# Patient Record
Sex: Female | Born: 1960 | Race: White | Hispanic: No | Marital: Married | Smoking: Never smoker
Health system: Southern US, Community
[De-identification: ages and names within clinical notes are randomized; demographics above are authoritative.]

## PROBLEM LIST (undated history)

## (undated) DIAGNOSIS — M549 Dorsalgia, unspecified: Secondary | ICD-10-CM

## (undated) DIAGNOSIS — G43909 Migraine, unspecified, not intractable, without status migrainosus: Secondary | ICD-10-CM

## (undated) DIAGNOSIS — I10 Essential (primary) hypertension: Secondary | ICD-10-CM

## (undated) HISTORY — PX: CHOLECYSTECTOMY: SHX55

---

## 2004-04-29 ENCOUNTER — Ambulatory Visit: Payer: Self-pay | Admitting: Cardiology

## 2004-04-29 ENCOUNTER — Observation Stay (HOSPITAL_COMMUNITY): Admission: RE | Admit: 2004-04-29 | Discharge: 2004-04-30 | Payer: Self-pay | Admitting: Orthopedic Surgery

## 2005-11-17 ENCOUNTER — Ambulatory Visit (HOSPITAL_COMMUNITY): Admission: RE | Admit: 2005-11-17 | Discharge: 2005-11-17 | Payer: Self-pay | Admitting: Neurological Surgery

## 2008-02-03 IMAGING — CR DG CHEST 2V
2 series · 2 of 2 positions shown · non-contrast
Comparison: 04/28/04
 The heart size and mediastinal contours are within normal limits.  Both lungs are clear.  The visualized skeletal structures are unremarkable.

CLINICAL DATA: Herniated disc.  Preadmission radiograph.  
 ACOH7-X VIEWS:

[view not recorded (1 of 2)]
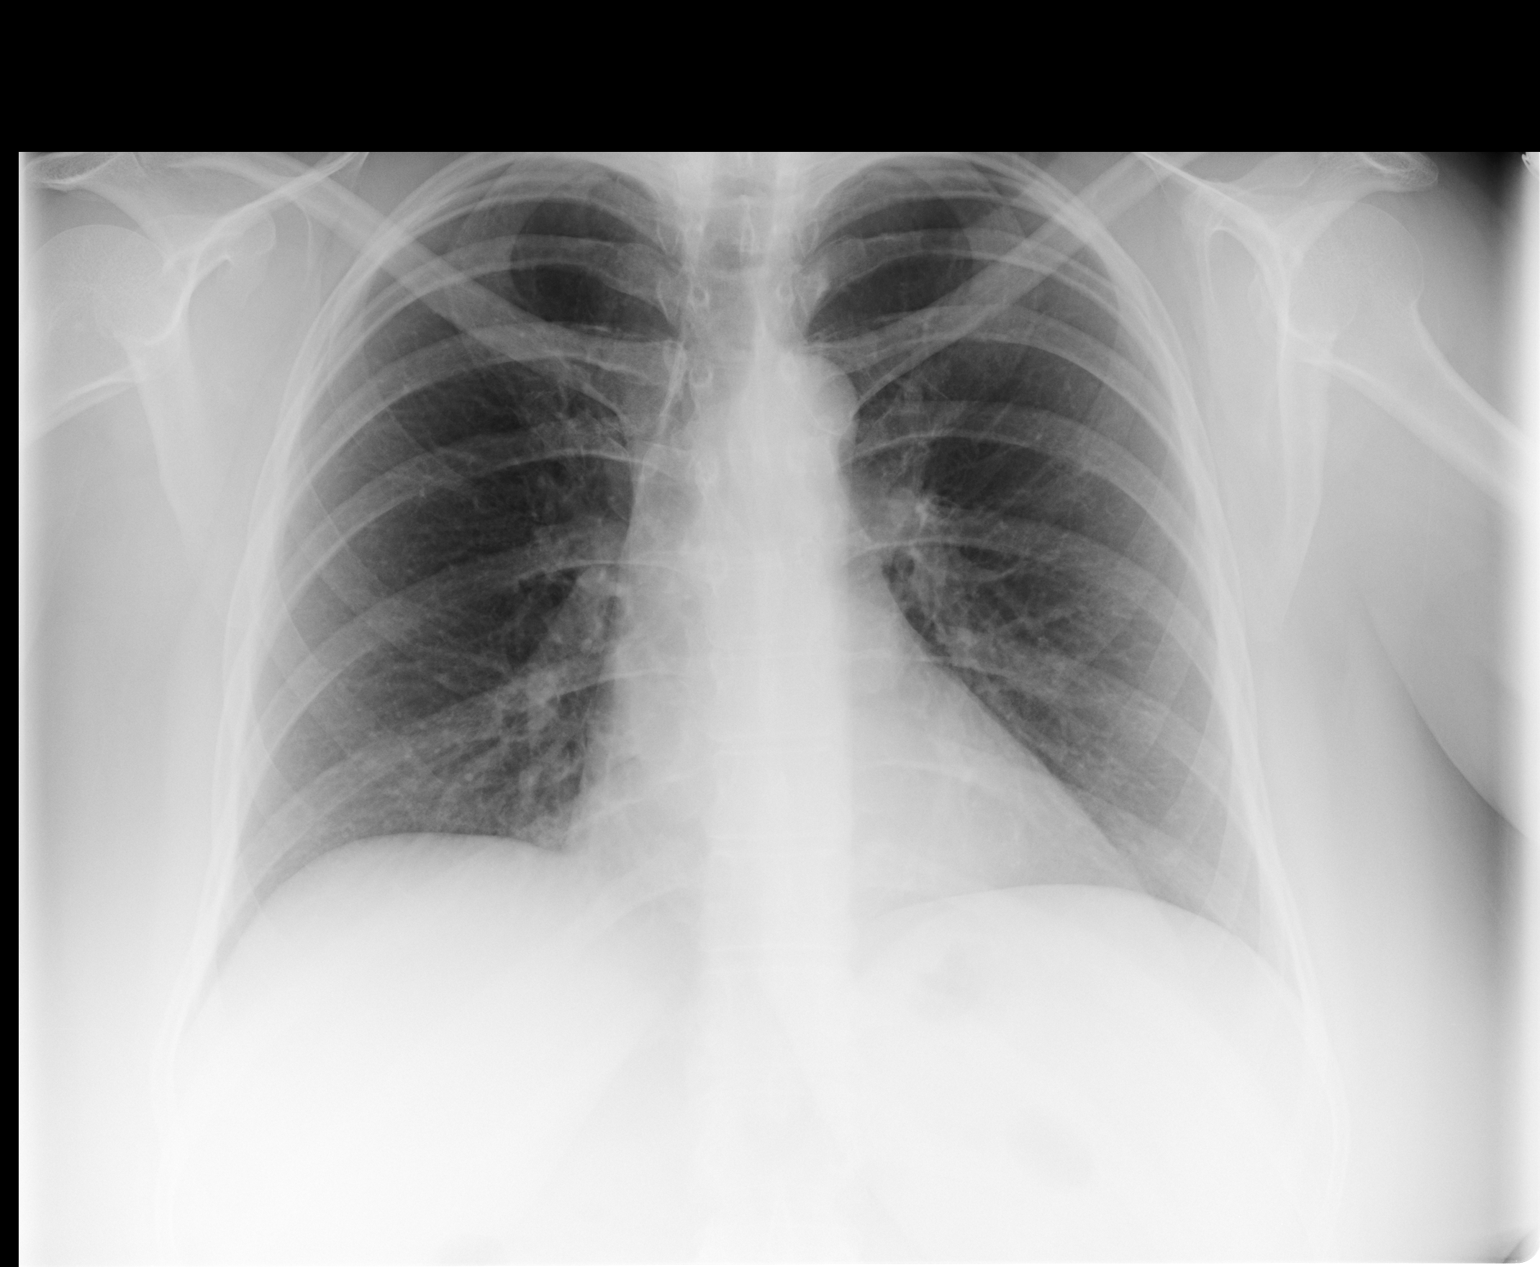

[view not recorded (2 of 2)]
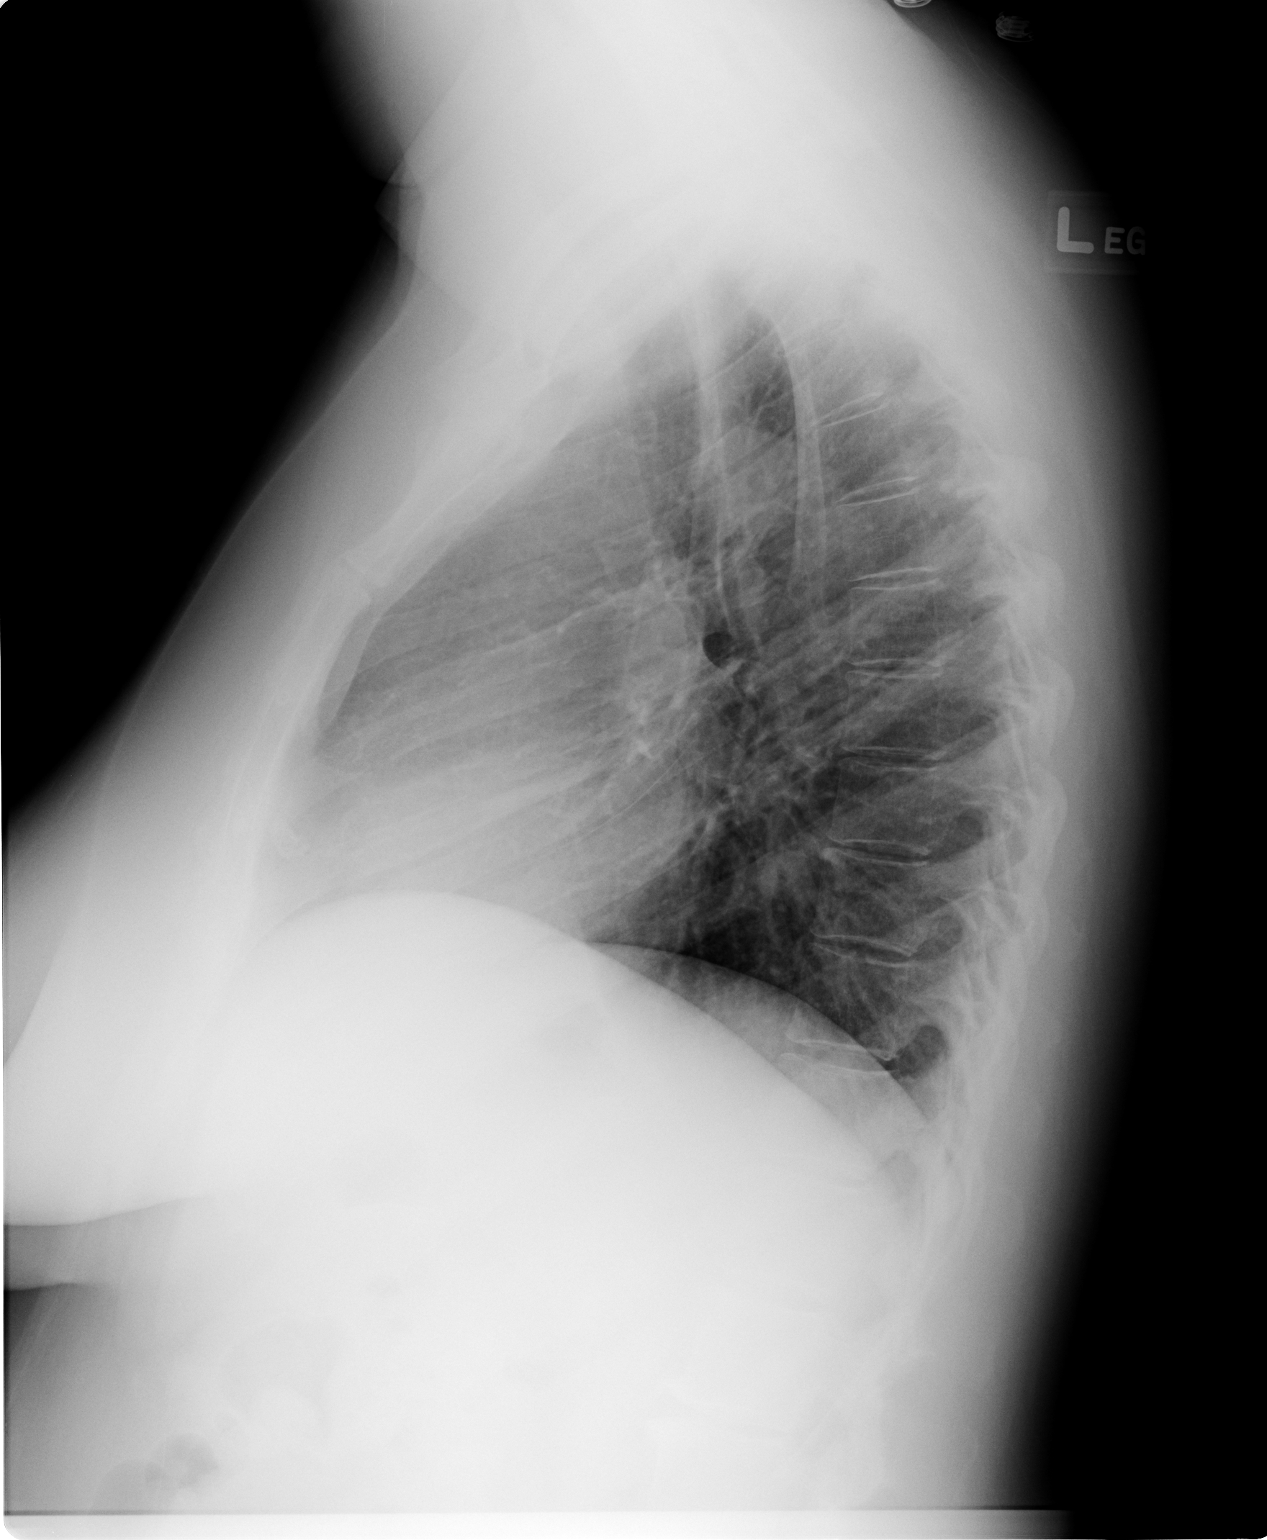

[2 of 2 positions shown; findings below may reference images not displayed]

IMPRESSION: No active cardiopulmonary disease.

## 2008-02-07 IMAGING — CR DG LUMBAR SPINE 1V
1 series · 1 of 1 positions shown · non-contrast
Comparison: 04/29/04

CLINICAL DATA: L4-L5-S1 microdiskectomy.
 LUMBAR SPINE ? 1 VIEW:

[view not recorded]
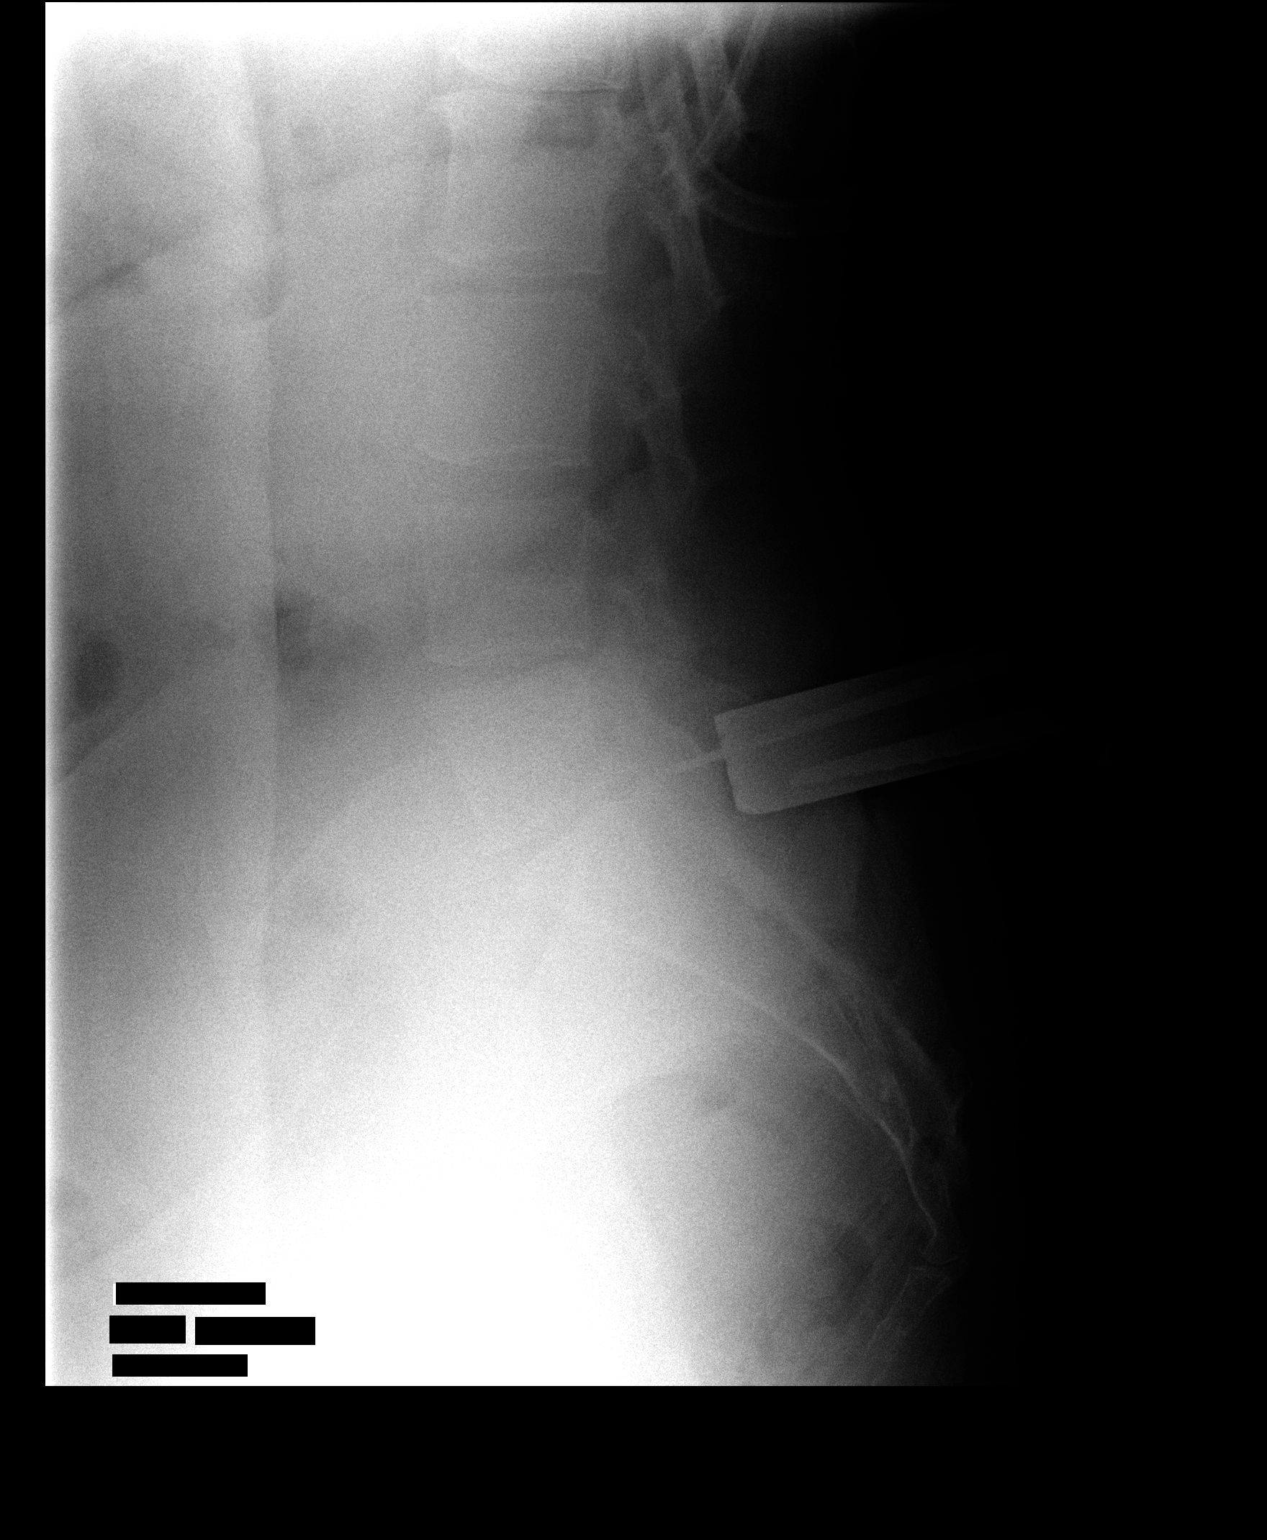

[1 of 1 positions shown; findings below may reference images not displayed]

FINDINGS: Single lateral view labeled film #1 at 4808 hours demonstrates a surgical device projecting posterior to the L5-S1 intervertebral disk.  Straightening of the expected lumbar lordosis.
IMPRESSION: Localization of the L5-S1 level.

## 2022-08-08 ENCOUNTER — Emergency Department (HOSPITAL_BASED_OUTPATIENT_CLINIC_OR_DEPARTMENT_OTHER): Payer: BC Managed Care – PPO

## 2022-08-08 ENCOUNTER — Other Ambulatory Visit: Payer: Self-pay

## 2022-08-08 ENCOUNTER — Encounter (HOSPITAL_BASED_OUTPATIENT_CLINIC_OR_DEPARTMENT_OTHER): Payer: Self-pay | Admitting: Emergency Medicine

## 2022-08-08 ENCOUNTER — Emergency Department (HOSPITAL_BASED_OUTPATIENT_CLINIC_OR_DEPARTMENT_OTHER)
Admission: EM | Admit: 2022-08-08 | Discharge: 2022-08-09 | Disposition: A | Discharge status: Home / Self Care | Payer: BC Managed Care – PPO | Attending: Emergency Medicine | Admitting: Emergency Medicine

## 2022-08-08 DIAGNOSIS — S300XXA Contusion of lower back and pelvis, initial encounter: Secondary | ICD-10-CM | POA: Diagnosis not present

## 2022-08-08 DIAGNOSIS — W010XXA Fall on same level from slipping, tripping and stumbling without subsequent striking against object, initial encounter: Secondary | ICD-10-CM | POA: Diagnosis not present

## 2022-08-08 DIAGNOSIS — M25552 Pain in left hip: Secondary | ICD-10-CM | POA: Diagnosis not present

## 2022-08-08 DIAGNOSIS — S20211A Contusion of right front wall of thorax, initial encounter: Secondary | ICD-10-CM | POA: Insufficient documentation

## 2022-08-08 DIAGNOSIS — R0789 Other chest pain: Secondary | ICD-10-CM | POA: Diagnosis present

## 2022-08-08 DIAGNOSIS — T07XXXA Unspecified multiple injuries, initial encounter: Secondary | ICD-10-CM

## 2022-08-08 MED ORDER — ACETAMINOPHEN 500 MG PO TABS
1000.0000 mg | ORAL_TABLET | Freq: Once | ORAL | Status: AC
  Administered 2022-08-08: 1000 mg via ORAL
  Filled 2022-08-08: qty 2

## 2022-08-08 MED ORDER — KETOROLAC TROMETHAMINE 15 MG/ML IJ SOLN
15.0000 mg | Freq: Once | INTRAMUSCULAR | Status: AC
  Administered 2022-08-08: 15 mg via INTRAMUSCULAR
  Filled 2022-08-08: qty 1

## 2022-08-08 NOTE — ED Provider Notes (Signed)
[facility] Provider Note  CSN: [id number] Arrival date & time: 08/08/22 1810  Chief Complaint(s) Fall  HPI Cheryl Lawrence is a 62 y.o. female who denies significant past medical history presenting to the emergency department with pain.  Patient reports right-sided chest pain worse with movement, left flank left hip pain.  She reports that her granddaughter was crawling underneath her and she tried to avoid and then fell into a kitchen table.  Did not hit her head or lose consciousness.  No nausea or vomiting.  No shortness of breath.  No pain in the arms or legs.  Has been ambulatory.  Did not take anything for pain before coming to the emergency department.  Past Medical History History reviewed. No pertinent past medical history. There are no problems to display for this patient.  Home Medication(s) Prior to Admission medications   Not on File                                                                                                                                    Past Surgical History History reviewed. No pertinent surgical history. Family History History reviewed. No pertinent family history.  Social History Social History   Tobacco Use   Smoking status: Never   Smokeless tobacco: Never   Allergies Duloxetine, Furosemide, Penicillins, Valproic acid, Codeine, Morphine, Nortriptyline, and Other  Review of Systems Review of Systems  All other systems reviewed and are negative.   Physical Exam Vital Signs  I have reviewed the triage vital signs BP (!) 176/99   Pulse 92   Temp 98.2 F (36.8 C) (Oral)   Resp 18   Ht 5\' 5"  (1.651 m)   Wt 93 kg   SpO2 99%   BMI 34.11 kg/m  Physical Exam Vitals and nursing note reviewed.  Constitutional:      General: She is not in acute distress.    Appearance: She is well-developed.  HENT:     Head: Normocephalic and atraumatic.     Mouth/Throat:     Mouth: Mucous  membranes are moist.  Eyes:     Pupils: Pupils are equal, round, and reactive to light.  Cardiovascular:     Rate and Rhythm: Normal rate and regular rhythm.     Heart sounds: No murmur heard. Pulmonary:     Effort: Pulmonary effort is normal. No respiratory distress.     Breath sounds: Normal breath sounds.  Abdominal:     General: Abdomen is flat.     Palpations: Abdomen is soft.     Tenderness: There is no abdominal tenderness.  Musculoskeletal:        General: No tenderness.     Right lower leg: No edema.     Left lower leg: No edema.     Comments: No midline C, T, L-spine tenderness.  No chest wall tenderness or crepitus.  Full painless range  of motion at the bilateral upper extremities including the shoulders, elbows, wrists, hand and fingers, and in the bilateral lower extremities including the hips, knees, ankle, toes.  No focal bony tenderness, injury or deformity.   Skin:    General: Skin is warm and dry.     Comments: Small linear ecchymosis over the right lateral chest wall without underlying crepitus or bony tenderness, small ecchymosis over the left paraspinal lumbar region.  Neurological:     General: No focal deficit present.     Mental Status: She is alert. Mental status is at baseline.  Psychiatric:        Mood and Affect: Mood normal.        Behavior: Behavior normal.     ED Results and Treatments Labs (all labs ordered are listed, but only abnormal results are displayed) Labs Reviewed - No data to display                                                                                                                        Radiology DG Hip Unilat W or Wo Pelvis 2-3 Views Right  Result Date: 08/08/2022 CLINICAL DATA:  Recent trip and fall with right hip pain, initial encounter EXAM: DG HIP (WITH OR WITHOUT PELVIS) 3V RIGHT COMPARISON:  None Available. FINDINGS: Pelvic ring is intact. Degenerative changes of lumbar spine are noted. No acute fracture or  dislocation is seen. No soft tissue abnormality is noted. IMPRESSION: No acute abnormality noted. Electronically Signed   By: Alcide Clever M.D.   On: 08/08/2022 19:40   DG Chest 2 View  Result Date: 08/08/2022 CLINICAL DATA:  Recent fall with chest pain, initial encounter EXAM: CHEST - 2 VIEW COMPARISON:  06/08/2021 FINDINGS: Cardiac shadow is stable. Lungs are clear bilaterally. No pneumothorax is noted. Degenerative changes of the thoracic spine are noted. No acute bony abnormality is seen. IMPRESSION: No active cardiopulmonary disease. Electronically Signed   By: Alcide Clever M.D.   On: 08/08/2022 19:40    Pertinent labs & imaging results that were available during my care of the patient were reviewed by me and considered in my medical decision making (see MDM for details).  Medications Ordered in ED Medications  acetaminophen (TYLENOL) tablet 1,000 mg (1,000 mg Oral Given 08/08/22 2258)  ketorolac (TORADOL) 15 MG/ML injection 15 mg (15 mg Intramuscular Given 08/08/22 2258)  Procedures Procedures  (including critical care time)  Medical Decision Making / ED Course   MDM:  62 year old female presenting to the emergency department with fall.  Patient reports she tripped over her granddaughter.  On exam she has scattered ecchymoses.  No evidence of head injury.  No evidence of extremity injury.  No midline spinal tenderness.  Lungs clear.  X-rays ordered in triage including x-ray of the right hip, chest x-ray without evidence of fracture.  Very low concern for significant traumatic injury.  Patient has not take anything for pain since coming to the emergency department.  Will give Tylenol and Toradol.  Discussed home care for symptoms including Tylenol and ibuprofen. Will discharge patient to home. All questions answered. Patient comfortable with plan of  discharge. Return precautions discussed with patient and specified on the after visit summary.       Additional history obtained: -Additional history obtained from spouse    Imaging Studies ordered: I ordered imaging studies including XR hip and chest On my interpretation imaging demonstrates no acute process I independently visualized and interpreted imaging. I agree with the radiologist interpretation   Medicines ordered and prescription drug management: Meds ordered this encounter  Medications   acetaminophen (TYLENOL) tablet 1,000 mg   ketorolac (TORADOL) 15 MG/ML injection 15 mg    -I have reviewed the patients home medicines and have made adjustments as needed   Social Determinants of Health:  Diagnosis or treatment significantly limited by social determinants of health: obesity   Reevaluation: After the interventions noted above, I reevaluated the patient and found that their symptoms have improved  Co morbidities that complicate the patient evaluation History reviewed. No pertinent past medical history.    Dispostion: Disposition decision including need for hospitalization was considered, and patient discharged from emergency department.    Final Clinical Impression(s) / ED Diagnoses Final diagnoses:  Multiple contusions     This chart was dictated using voice recognition software.  Despite best efforts to proofread,  errors can occur which can change the documentation meaning.    Lonell Grandchild, MD 08/08/22 2303

## 2022-08-08 NOTE — ED Triage Notes (Signed)
Pt sts she tripped over her granddaughter today and hit the kitchen bar on the way down; she c/o pain to LT flank, LT hip and RT ribs

## 2022-08-08 NOTE — Discharge Instructions (Addendum)
We evaluated you in the emergency department after your fall.  Your x-rays did not show any signs of broken bones.  You likely have contusions, which is a type of bruise.   Please take Tylenol and Motrin for your symptoms at home.  You can take 1000 mg of Tylenol every 6 hours and 600 mg of ibuprofen every 6 hours as needed for your symptoms.  You can take these medicines together as needed, either at the same time, or alternating every 3 hours.  You can also apply ice packs to your areas of pain.  Please follow-up with your primary doctor.  Please return to the emergency department if you develop any new symptoms such as difficulty breathing, severe pain, fevers or chills, confusion, nausea or vomiting, or any other concerning symptoms.

## 2022-11-18 ENCOUNTER — Encounter (HOSPITAL_BASED_OUTPATIENT_CLINIC_OR_DEPARTMENT_OTHER): Payer: Self-pay | Admitting: Emergency Medicine

## 2022-11-18 ENCOUNTER — Emergency Department (HOSPITAL_BASED_OUTPATIENT_CLINIC_OR_DEPARTMENT_OTHER)
Admission: EM | Admit: 2022-11-18 | Discharge: 2022-11-18 | Disposition: A | Discharge status: Home / Self Care | Payer: BC Managed Care – PPO | Attending: Emergency Medicine | Admitting: Emergency Medicine

## 2022-11-18 ENCOUNTER — Other Ambulatory Visit: Payer: Self-pay

## 2022-11-18 ENCOUNTER — Emergency Department (HOSPITAL_BASED_OUTPATIENT_CLINIC_OR_DEPARTMENT_OTHER): Payer: BC Managed Care – PPO

## 2022-11-18 DIAGNOSIS — M7989 Other specified soft tissue disorders: Secondary | ICD-10-CM | POA: Diagnosis present

## 2022-11-18 DIAGNOSIS — M79661 Pain in right lower leg: Secondary | ICD-10-CM | POA: Insufficient documentation

## 2022-11-18 HISTORY — DX: Dorsalgia, unspecified: M54.9

## 2022-11-18 HISTORY — DX: Migraine, unspecified, not intractable, without status migrainosus: G43.909

## 2022-11-18 HISTORY — DX: Essential (primary) hypertension: I10

## 2022-11-18 NOTE — ED Triage Notes (Signed)
Right lower leg swelling for 4 weeks, worse after surgery 3 weeks ago (gallbladder).  Denies chest pain or sob

## 2022-11-18 NOTE — Discharge Instructions (Signed)
You had an ultrasound of your right lower leg that did not show any evidence of blood clot.  Please elevate your leg when possible.  Follow-up with your primary care doctor.  You can also use compression stockings.  Return if any worsening or concerning symptoms

## 2022-11-18 NOTE — ED Provider Notes (Signed)
[facility] Provider Note   CSN: [id number] Arrival date & time: 11/18/22  0710     History {Add pertinent medical, surgical, social history, OB history to HPI:1} Chief Complaint  Patient presents with   Leg Swelling    Cheryl Lawrence is a 62 y.o. female.  She is here with a complaint of swelling of her right leg.  She says it has been there for about 3 weeks.  She had a cholecystectomy 2 weeks ago.  Continues to have leg swelling.  Worse at night and causes pain when it swollen.  No numbness or weakness.  No trauma.  No difficulty walking.  She says she talked to her primary care doctor who recommended she come here to get an ultrasound.  No prior history of DVT.  Not on any blood thinners.  No chest pain or shortness of breath no fevers or chills.  The history is provided by the patient.  Leg Pain Location:  Leg and foot Leg location:  R lower leg Foot location:  R foot Pain details:    Quality:  Aching   Severity:  Moderate   Onset quality:  Gradual   Duration:  3 weeks   Timing:  Intermittent   Progression:  Unchanged Chronicity:  New Prior injury to area:  No Relieved by:  Nothing Ineffective treatments:  Elevation Associated symptoms: swelling   Associated symptoms: no fever, no muscle weakness and no numbness        Home Medications Prior to Admission medications   Not on File      Allergies    Duloxetine, Furosemide, Penicillins, Valproic acid, Codeine, Morphine, Nortriptyline, and Other    Review of Systems   Review of Systems  Constitutional:  Negative for fever.  Respiratory:  Negative for shortness of breath.   Cardiovascular:  Positive for leg swelling. Negative for chest pain.  Skin:  Negative for wound.  Neurological:  Negative for weakness and numbness.    Physical Exam Updated Vital Signs BP 136/61 (BP Location: Left Arm)   Pulse 80   Temp 97.7 F (36.5 C) (Oral)   Resp 18   Ht 5\' 5"  (1.651  m)   Wt 94.3 kg   SpO2 95%   BMI 34.61 kg/m  Physical Exam Constitutional:      Appearance: Normal appearance. She is well-developed.  HENT:     Head: Normocephalic and atraumatic.  Eyes:     Conjunctiva/sclera: Conjunctivae normal.  Musculoskeletal:        General: No tenderness. Normal range of motion.     Cervical back: Neck supple.     Right lower leg: No edema.     Left lower leg: No edema.     Comments: Right leg with no appreciable swelling at this time.  No cords appreciated.  No overlying skin changes.  Distal pulses motor and sensation intact.  Skin:    General: Skin is warm and dry.  Neurological:     General: No focal deficit present.     Mental Status: She is alert.     GCS: GCS eye subscore is 4. GCS verbal subscore is 5. GCS motor subscore is 6.     Sensory: No sensory deficit.     Motor: No weakness.     ED Results / Procedures / Treatments   Labs (all labs ordered are listed, but only abnormal results are displayed) Labs Reviewed - No data to display  EKG None  Radiology No results found.  Procedures Procedures  {Document cardiac monitor, telemetry assessment procedure when appropriate:1}  Medications Ordered in ED Medications - No data to display  ED Course/ Medical Decision Making/ A&P   {   Click here for ABCD2, HEART and other calculatorsREFRESH Note before signing :1}                              Medical Decision Making  ***  {Document critical care time when appropriate:1} {Document review of labs and clinical decision tools ie heart score, Chads2Vasc2 etc:1}  {Document your independent review of radiology images, and any outside records:1} {Document your discussion with family members, caretakers, and with consultants:1} {Document social determinants of health affecting pt's care:1} {Document your decision making why or why not admission, treatments were needed:1} Final Clinical Impression(s) / ED Diagnoses Final diagnoses:  None     Rx / DC Orders ED Discharge Orders     None

## 2023-12-25 ENCOUNTER — Emergency Department (HOSPITAL_BASED_OUTPATIENT_CLINIC_OR_DEPARTMENT_OTHER)
Admission: EM | Admit: 2023-12-25 | Discharge: 2023-12-25 | Disposition: A | Discharge status: Home / Self Care | Attending: Emergency Medicine | Admitting: Emergency Medicine

## 2023-12-25 ENCOUNTER — Emergency Department (HOSPITAL_BASED_OUTPATIENT_CLINIC_OR_DEPARTMENT_OTHER)

## 2023-12-25 ENCOUNTER — Other Ambulatory Visit: Payer: Self-pay

## 2023-12-25 ENCOUNTER — Encounter (HOSPITAL_BASED_OUTPATIENT_CLINIC_OR_DEPARTMENT_OTHER): Payer: Self-pay | Admitting: Emergency Medicine

## 2023-12-25 DIAGNOSIS — I10 Essential (primary) hypertension: Secondary | ICD-10-CM | POA: Insufficient documentation

## 2023-12-25 DIAGNOSIS — S0083XA Contusion of other part of head, initial encounter: Secondary | ICD-10-CM | POA: Insufficient documentation

## 2023-12-25 DIAGNOSIS — R110 Nausea: Secondary | ICD-10-CM

## 2023-12-25 DIAGNOSIS — S0990XA Unspecified injury of head, initial encounter: Secondary | ICD-10-CM | POA: Diagnosis present

## 2023-12-25 DIAGNOSIS — W06XXXA Fall from bed, initial encounter: Secondary | ICD-10-CM | POA: Insufficient documentation

## 2023-12-25 MED ORDER — ONDANSETRON 4 MG PO TBDP: 4.0000 mg | ORAL_TABLET | Freq: Three times a day (TID) | ORAL | 0 refills | Status: AC | PRN

## 2023-12-25 MED ORDER — ONDANSETRON 4 MG PO TBDP
4.0000 mg | ORAL_TABLET | Freq: Once | ORAL | Status: AC
  Administered 2023-12-25: 4 mg via ORAL
  Filled 2023-12-25: qty 1

## 2023-12-25 NOTE — ED Provider Notes (Signed)
 Emergency Department Provider Note   I have reviewed the triage vital signs and the nursing notes.   HISTORY  Chief Complaint Head Injury   HPI Cheryl Lawrence is a 63 y.o. female past history of hypertension presents emergency department for evaluation of headache after rolling out of bed.  She was asleep and having a dream and apparently rolled in bed causing her to fall hitting her forehead on a nightstand.  She awoke immediately.  Since that time she has noticed some swelling and bruising to the right forehead and slight blurry vision to the right eye.  She notes some mild nausea but no vomiting.  No confusion.  No neck pain.  No pain in other locations.   Past Medical History:  Diagnosis Date   Back pain    Hypertension    Migraines     Review of Systems  Constitutional: No fever/chills Eyes: Blurry vision to the right eye.  Cardiovascular: Denies chest pain. Respiratory: Denies shortness of breath. Gastrointestinal: No abdominal pain. Positive nausea.  Skin: Negative for rash. Neurological: Positive nausea.   ____________________________________________   PHYSICAL EXAM:  VITAL SIGNS: ED Triage Vitals  Encounter Vitals Group     BP 12/25/23 0158 126/80     Pulse Rate 12/25/23 0158 68     Resp 12/25/23 0158 18     Temp 12/25/23 0158 98.3 F (36.8 C)     Temp Source 12/25/23 0158 Oral     SpO2 12/25/23 0158 96 %     Weight 12/25/23 0200 193 lb (87.5 kg)     Height 12/25/23 0200 5' 5 (1.651 m)   Constitutional: Alert and oriented. Well appearing and in no acute distress. Eyes: Conjunctivae are normal. PERRL. EOMI. no signs of trapped meant.  No proptosis Head: Small hematoma to the right forehead without laceration. Some mild ecchymosis noted. Nose: No congestion/rhinnorhea. Mouth/Throat: Mucous membranes are moist.   Neck: No stridor. No cervical spine tenderness to palpation. Cardiovascular: Normal rate, regular rhythm. Good peripheral circulation.  Grossly normal heart sounds.   Respiratory: Normal respiratory effort.  No retractions. Lungs CTAB. Gastrointestinal: No distention.  Musculoskeletal: No gross deformities of extremities. Neurologic:  Normal speech and language. No gross focal neurologic deficits are appreciated.  Skin:  Skin is warm, dry and intact. No rash noted.  ____________________________________________  RADIOLOGY  CT Head Wo Contrast Result Date: 12/25/2023 CLINICAL DATA:  63 year old female status post fall from bed striking head on furniture. Forehead hematoma. Right eye blurred vision. Headache. EXAM: CT HEAD WITHOUT CONTRAST TECHNIQUE: Contiguous axial images were obtained from the base of the skull through the vertex without intravenous contrast. RADIATION DOSE REDUCTION: This exam was performed according to the departmental dose-optimization program which includes automated exposure control, adjustment of the mA and/or kV according to patient size and/or use of iterative reconstruction technique. COMPARISON:  Brain MRI 01/23/2017.  Head CT 07/17/2020. FINDINGS: Brain: Normal cerebral volume. Small chronic infarct in the left lateral cerebellar hemisphere is unchanged from 2018 MRI (series 2, image 6). And otherwise gray-white differentiation is normal. No midline shift, ventriculomegaly, mass effect, evidence of mass lesion, intracranial hemorrhage or evidence of cortically based acute infarction. Vascular: Calcified atherosclerosis at the skull base. No suspicious intracranial vascular hyperdensity. Skull: Stable and intact. Sinuses/Orbits: Visualized paranasal sinuses and mastoids are clear. Other: Right forehead, supraorbital scalp hematoma is broad-based and measures 7 mm in thickness. Underlying right frontal bone and frontal sinus appear intact. No scalp soft tissue gas. Globes and intraorbital soft  tissues appear normal. IMPRESSION: 1. Right forehead scalp hematoma.  No skull fracture. 2. No acute intracranial  abnormality. Small chronic left cerebellar infarct. Electronically Signed   By: VEAR Hurst M.D.   On: 12/25/2023 03:57    ____________________________________________   PROCEDURES  Procedure(s) performed:   Procedures  None ____________________________________________   INITIAL IMPRESSION / ASSESSMENT AND PLAN / ED COURSE  Pertinent labs & imaging results that were available during my care of the patient were reviewed by me and considered in my medical decision making (see chart for details).   This patient is Presenting for Evaluation of head injury, which does require a range of treatment options, and is a complaint that involves a high risk of morbidity and mortality.  The Differential Diagnoses includes subdural hematoma, epidural hematoma, acute concussion, traumatic subarachnoid hemorrhage, cerebral contusions, etc.   Critical Interventions-    Medications  ondansetron  (ZOFRAN -ODT) disintegrating tablet 4 mg (4 mg Oral Given 12/25/23 0310)    Reassessment after intervention: nausea improved.   Radiologic Tests Ordered, included CT head. I independently interpreted the images and agree with radiology interpretation.   Medical Decision Making: Summary:  The patient presents the emergency department with headache after falling out of bed.  Mild hematoma noted.  Plan for CT head given nausea, persistent headache, blurry vision to the right eye.  No evidence of entrapment or obvious ocular injury.  Reevaluation with update and discussion with patient. CT reassuring. Sent Rx for Zofran  to the pharmacy and discussed mild concussion as a possibility. Patient to follow with PCP in the next 1-2 weeks if symptoms worsen.   Patient's presentation is most consistent with acute presentation with potential threat to life or bodily function.   Disposition: discharge  ____________________________________________  FINAL CLINICAL IMPRESSION(S) / ED DIAGNOSES  Final diagnoses:  Injury  of head, initial encounter  Nausea     NEW OUTPATIENT MEDICATIONS STARTED DURING THIS VISIT:  New Prescriptions   ONDANSETRON  (ZOFRAN -ODT) 4 MG DISINTEGRATING TABLET    Take 1 tablet (4 mg total) by mouth every 8 (eight) hours as needed.    Note:  This document was prepared using Dragon voice recognition software and may include unintentional dictation errors.  Fonda Law, MD, [facility]    Quantrell Splitt, Fonda MATSU, MD 12/25/23 [phone]

## 2023-12-25 NOTE — Discharge Instructions (Signed)
 CT scan of your head is reassuring and shows no broken bones or internal bleeding.  He may have mild concussion leading to your nausea.  I have called some nausea medication into your pharmacy.  Please follow-up with your primary care doctor in the coming 1 to 2 weeks if symptoms persist.

## 2023-12-25 NOTE — ED Triage Notes (Signed)
 Patient coming to ED for evaluation of head injury.  Reports she was in bed and was dreaming that someone was chasing me.  States she fell out of bed and hit head on night stand.  Large hematoma noted to forehead.  No reports of LOC.  Slight blurred vision to R eye and mild HA.  No blood thinners.
